# Patient Record
Sex: Female | Born: 1945 | Race: White | Hispanic: No | Smoking: Never smoker
Health system: Southern US, Community
[De-identification: ages and names within clinical notes are randomized; demographics above are authoritative.]

## PROBLEM LIST (undated history)

## (undated) DIAGNOSIS — M1990 Unspecified osteoarthritis, unspecified site: Secondary | ICD-10-CM

## (undated) DIAGNOSIS — M810 Age-related osteoporosis without current pathological fracture: Secondary | ICD-10-CM

---

## 2021-10-21 ENCOUNTER — Other Ambulatory Visit: Payer: Self-pay

## 2021-10-21 ENCOUNTER — Emergency Department (HOSPITAL_COMMUNITY): Payer: Medicare Other

## 2021-10-21 ENCOUNTER — Emergency Department (HOSPITAL_COMMUNITY)
Admission: EM | Admit: 2021-10-21 | Discharge: 2021-10-21 | Disposition: A | Discharge status: Home / Self Care | Payer: Medicare Other | Attending: Emergency Medicine | Admitting: Emergency Medicine

## 2021-10-21 ENCOUNTER — Encounter (HOSPITAL_COMMUNITY): Payer: Self-pay

## 2021-10-21 DIAGNOSIS — W109XXA Fall (on) (from) unspecified stairs and steps, initial encounter: Secondary | ICD-10-CM | POA: Insufficient documentation

## 2021-10-21 DIAGNOSIS — S32038A Other fracture of third lumbar vertebra, initial encounter for closed fracture: Secondary | ICD-10-CM | POA: Diagnosis not present

## 2021-10-21 DIAGNOSIS — M25551 Pain in right hip: Secondary | ICD-10-CM | POA: Insufficient documentation

## 2021-10-21 DIAGNOSIS — S3992XA Unspecified injury of lower back, initial encounter: Secondary | ICD-10-CM | POA: Diagnosis present

## 2021-10-21 DIAGNOSIS — S32018A Other fracture of first lumbar vertebra, initial encounter for closed fracture: Secondary | ICD-10-CM | POA: Insufficient documentation

## 2021-10-21 DIAGNOSIS — R0789 Other chest pain: Secondary | ICD-10-CM | POA: Insufficient documentation

## 2021-10-21 DIAGNOSIS — S32009A Unspecified fracture of unspecified lumbar vertebra, initial encounter for closed fracture: Secondary | ICD-10-CM

## 2021-10-21 HISTORY — DX: Age-related osteoporosis without current pathological fracture: M81.0

## 2021-10-21 HISTORY — DX: Unspecified osteoarthritis, unspecified site: M19.90

## 2021-10-21 MED ORDER — ONDANSETRON HCL 4 MG PO TABS: 4.0000 mg | ORAL_TABLET | Freq: Four times a day (QID) | ORAL | 0 refills | Status: AC | PRN

## 2021-10-21 MED ORDER — LIDOCAINE 5 % EX PTCH
1.0000 | MEDICATED_PATCH | CUTANEOUS | Status: DC
  Administered 2021-10-21: 1 via TRANSDERMAL
  Filled 2021-10-21: qty 1

## 2021-10-21 MED ORDER — IBUPROFEN 400 MG PO TABS
600.0000 mg | ORAL_TABLET | Freq: Once | ORAL | Status: AC
Start: 2021-10-21 — End: 2021-10-21
  Administered 2021-10-21: 600 mg via ORAL
  Filled 2021-10-21: qty 1

## 2021-10-21 MED ORDER — TRAMADOL HCL 50 MG PO TABS: 50.0000 mg | ORAL_TABLET | Freq: Four times a day (QID) | ORAL | 0 refills | Status: AC | PRN

## 2021-10-21 NOTE — ED Provider Triage Note (Signed)
Emergency Medicine Provider Triage Evaluation Note ? ?Wendy Brock , a 76 y.o. female  was evaluated in triage.  Pt complains of fall.  Pt was walking down her brick steps outside her house yesterday, misstepped and fell down 7-8 steps.  Didn't hit her head but report pain to R hip.  Able to ambulate ? ?Review of Systems  ?Positive: As above ?Negative: As above ? ?Physical Exam  ?BP (!) 163/85 (BP Location: Right Arm)   Pulse 65   Temp 98.4 ?F (36.9 ?C) (Oral)   Resp 14   Ht 5' 6.5" (1.689 m)   Wt 54.4 kg   SpO2 97%   BMI 19.08 kg/m?  ?Gen:   Awake, no distress   ?Resp:  Normal effort  ?MSK:   Moves extremities without difficulty  ?Other:  TTP R hip ? ?Medical Decision Making  ?Medically screening exam initiated at 9:23 AM.  Appropriate orders placed.  Lakyia Behe was informed that the remainder of the evaluation will be completed by another provider, this initial triage assessment does not replace that evaluation, and the importance of remaining in the ED until their evaluation is complete. ? ? ?  ?Fayrene Helper, PA-C ?10/21/21 [id number] ? ?

## 2021-10-21 NOTE — ED Notes (Signed)
Pt got brace and left without AVS. Patient refused last vitals ? ?

## 2021-10-21 NOTE — Discharge Instructions (Addendum)
Please return to the ED with any new symptoms such as weakness, numbness, pain that is not responsive to oral narcotic medication ?Please follow-up with an neurosurgeon when you return to [place] ?Please remain in the brace at all times unless showering ?Please pick up your prescription pain medication that I have sent in for you.  Please also pick up your prescription antinausea medication that I sent in for you. ?Please review the attached informational guides concerning your injury.  You have an L1 through L3 transverse spinous process fracture on the right side. ?

## 2021-10-21 NOTE — ED Provider Notes (Signed)
?[facility] ?Provider Note ? ? ?CSN: [id number] ?Arrival date & time: 10/21/21  0844 ? ?  ? ?History ? ?Chief Complaint  ?Patient presents with  ? Fall  ? ? ?Wendy Brock is a 76 y.o. female medical history of arthritis, osteoporosis.  Patient reports that last night, around 8:30 PM, she was walking down brick stairs when she tripped causing herself to fall down 8 brick stairs landing on her right side.  Patient denies taking blood thinners, denies losing consciousness, denies hitting her head.  Patient states that she was able to get up off the ground, and overnight her aches and pains increased causing her to present to ER.  Patient is mainly complaining of right-sided low back pain, right-sided chest wall pain as well as right-sided hip pain.  Patient also states that she is having a hard time ambulating.  At baseline, the patient states that she walks without assistance.  The patient denies any nausea, vomiting, neck pain, knee pain, abdominal pain, headache, lightheadedness, dizziness, confusion.  Patient denies any bowel or bladder dysfunction, groin numbness.  Patient states that she is having issues with ambulation. ? ? ?Fall ?Pertinent negatives include no abdominal pain and no headaches.  ? ?  ? ?Home Medications ?Prior to Admission medications   ?Medication Sig Start Date End Date Taking? Authorizing Provider  ?ondansetron (ZOFRAN) 4 MG tablet Take 1 tablet (4 mg total) by mouth every 6 (six) hours as needed for nausea or vomiting. 10/21/21  Yes Azucena Cecil, PA-C  ?traMADol (ULTRAM) 50 MG tablet Take 1 tablet (50 mg total) by mouth every 6 (six) hours as needed for severe pain. 10/21/21  Yes Azucena Cecil, PA-C  ?   ? ?Allergies    ?Codeine   ? ?Review of Systems   ?Review of Systems  ?Gastrointestinal:  Negative for abdominal pain, nausea and vomiting.  ?Musculoskeletal:  Positive for back pain and myalgias. Negative for arthralgias and neck pain.   ?Neurological:  Negative for dizziness, light-headedness and headaches.  ?All other systems reviewed and are negative. ? ?Physical Exam ?Updated Vital Signs ?BP (!) 163/85 (BP Location: Right Arm)   Pulse 65   Temp 98.4 ?F (36.9 ?C) (Oral)   Resp 14   Ht 5' 6.5" (1.689 m)   Wt 54.4 kg   SpO2 97%   BMI 19.08 kg/m?  ?Physical Exam ?Vitals and nursing note reviewed.  ?Constitutional:   ?   General: She is not in acute distress. ?   Appearance: Normal appearance. She is not ill-appearing, toxic-appearing or diaphoretic.  ?HENT:  ?   Head: Normocephalic and atraumatic.  ?   Nose: Nose normal. No congestion.  ?   Mouth/Throat:  ?   Mouth: Mucous membranes are moist.  ?   Pharynx: Oropharynx is clear.  ?Eyes:  ?   Extraocular Movements: Extraocular movements intact.  ?   Conjunctiva/sclera: Conjunctivae normal.  ?   Pupils: Pupils are equal, round, and reactive to light.  ?Cardiovascular:  ?   Rate and Rhythm: Normal rate and regular rhythm.  ?Pulmonary:  ?   Effort: Pulmonary effort is normal.  ?   Breath sounds: Normal breath sounds. No wheezing.  ?Abdominal:  ?   General: Abdomen is flat. Bowel sounds are normal.  ?   Palpations: Abdomen is soft.  ?   Tenderness: There is no abdominal tenderness.  ?Musculoskeletal:  ?   Cervical back: Normal range of motion and neck supple. No tenderness.  ?  Lumbar back: Tenderness and bony tenderness present.  ?   Comments: Patient has diffuse right-sided lumbar tenderness on palpation.  No overlying skin change.  ?Skin: ?   General: Skin is warm and dry.  ?   Capillary Refill: Capillary refill takes less than 2 seconds.  ?Neurological:  ?   General: No focal deficit present.  ?   Mental Status: She is alert and oriented to person, place, and time.  ?   GCS: GCS eye subscore is 4. GCS verbal subscore is 5. GCS motor subscore is 6.  ?   Cranial Nerves: Cranial nerves 2-12 are intact. No cranial nerve deficit.  ?   Sensory: Sensation is intact. No sensory deficit.  ?   Motor:  Motor function is intact. No weakness.  ?   Coordination: Coordination is intact. Heel to [facility] Test normal.  ? ? ?ED Results / Procedures / Treatments   ?Labs ?(all labs ordered are listed, but only abnormal results are displayed) ?Labs Reviewed - No data to display ? ?EKG ?None ? ?Radiology ?DG Ribs Unilateral W/Chest Right ? ?Result Date: 10/21/2021 ?CLINICAL DATA:  Fall with pain. EXAM: RIGHT RIBS AND CHEST - 3+ VIEW COMPARISON:  None Available. FINDINGS: Lungs are hyperexpanded. The lungs are clear without focal pneumonia, edema, pneumothorax or pleural effusion. The cardiopericardial silhouette is within normal limits for size. Bones are diffusely demineralized. Oblique views of the right ribs show no evidence for an acute displaced right-sided rib fracture although assessment is limited by the marked bony demineralization. IMPRESSION: 1. Hyperexpansion suggests emphysema. 2. No acute cardiopulmonary findings 3. No evidence for right-sided rib fracture although assessment limited by osteopenia. Electronically Signed   By: Misty Stanley M.D.   On: 10/21/2021 12:46  ? ?CT Lumbar Spine Wo Contrast ? ?Result Date: 10/21/2021 ?CLINICAL DATA:  Back trauma.  Fall last night EXAM: CT LUMBAR SPINE WITHOUT CONTRAST TECHNIQUE: Multidetector CT imaging of the lumbar spine was performed without intravenous contrast administration. Multiplanar CT image reconstructions were also generated. RADIATION DOSE REDUCTION: This exam was performed according to the departmental dose-optimization program which includes automated exposure control, adjustment of the mA and/or kV according to patient size and/or use of iterative reconstruction technique. COMPARISON:  None Available. FINDINGS: Segmentation: 5 lumbar type vertebrae. Alignment: No traumatic malalignment. Vertebrae: Acute fractures of the L1, L2, and L3 right transverse processes, displaced except for the L1 fracture. Paraspinal and other soft tissues: Negative for perispinal  mass or hematoma Disc levels: Mild for age degeneration, mainly facet spurring. No visible impingement. IMPRESSION: Acute fractures of the L1 through L3 right transverse processes. Electronically Signed   By: Jorje Guild M.D.   On: 10/21/2021 13:01  ? ?DG Hip Unilat W or Wo Pelvis 2-3 Views Right ? ?Result Date: 10/21/2021 ?CLINICAL DATA:  [place] down 5-6 per steps last night. Lateral and posterior right hip pain. Right flank pain. EXAM: DG HIP (WITH OR WITHOUT PELVIS) 2-3V RIGHT COMPARISON:  None available FINDINGS: The bilateral sacroiliac bilateral femoroacetabular and pubic symphysis joint spaces appear maintained. No proximal right femoral fracture is identified. Stool and air within the rectum overlie the right pubic body. No definite right pubic body fracture is seen. There appears to be mild chronic irregularity of the left inferior pubic ramus, likely remote healed fracture. No dislocation. IMPRESSION:: IMPRESSION: 1. No acute fracture is seen with attention to the right hip. 2. Mild angulation and foreshortening of the left inferior pubic ramus, likely a remote healed fracture. Electronically Signed  By: Yvonne Kendall M.D.   On: 10/21/2021 10:35   ? ?Procedures ?Procedures  ? ?Medications Ordered in ED ?Medications  ?lidocaine (LIDODERM) 5 % 1 patch (1 patch Transdermal Patch Applied 10/21/21 1248)  ?ibuprofen (ADVIL) tablet 600 mg (600 mg Oral Given 10/21/21 1247)  ? ? ?ED Course/ Medical Decision Making/ A&P ?  ?                        ?Medical Decision Making ?Amount and/or Complexity of Data Reviewed ?Radiology: ordered. ? ?Risk ?Prescription drug management. ? ? ?76 year old female presents to ED for evaluation.  Please see HPI for further details ? ?On examination, the patient is afebrile, nontachycardic, nonhypoxic.  The patient lung sounds are clear bilaterally.  Patient abdomen is soft and compressible in all 4 quadrants.  Patient has diffuse right-sided low back pain and issues with ambulation.   The patient is able to ambulate freely however it is very labored.  Patient neurological examination shows no focal neurodeficits. ? ?Patient worked up utilizing following labs and imaging studies interpreted by me

## 2021-10-21 NOTE — ED Triage Notes (Signed)
Pt arrived POV from home c/o a fall that happened last night. Pt states she was leaving out of the house and fell down 7-8 brick stairs. Pt denies hitting her head or LOC. Pt states she does not take a blood thinner. Pt c/o of right hip pain and right lower back pain.  ?

## 2021-10-21 NOTE — ED Notes (Signed)
Pt is standing lobby looks like she going to fall. I ask pt if I can help her sit down pt stated no its to hard to sit and get back up. I asked if a wheelchair would be better she stated "no Im fine thank you for asking" pt continues to stand in placed.  ?

## 2021-10-21 NOTE — Progress Notes (Signed)
Orthopedic Tech Progress Note ?Patient Details:  ?Wendy Brock ?08/26/1945 ?[id number] ?Called in order to [facility] for [facility] ?Patient ID: Wendy Brock, female   DOB: 24-Jun-1945, 76 y.o.   MRN: [id number] ? ?[name] A Geoffrey Hynes ?10/21/2021, 2:11 PM ? ?

## 2021-10-21 NOTE — ED Notes (Signed)
Ortho called to place brace ? ?

## 2022-08-21 IMAGING — DX DG RIBS W/ CHEST 3+V*R*
3 series · 3 of 3 positions shown · non-contrast
Comparison: None Available.

CLINICAL DATA: Fall with pain.

EXAM:
RIGHT RIBS AND CHEST - 3+ VIEW

[w chest pa]
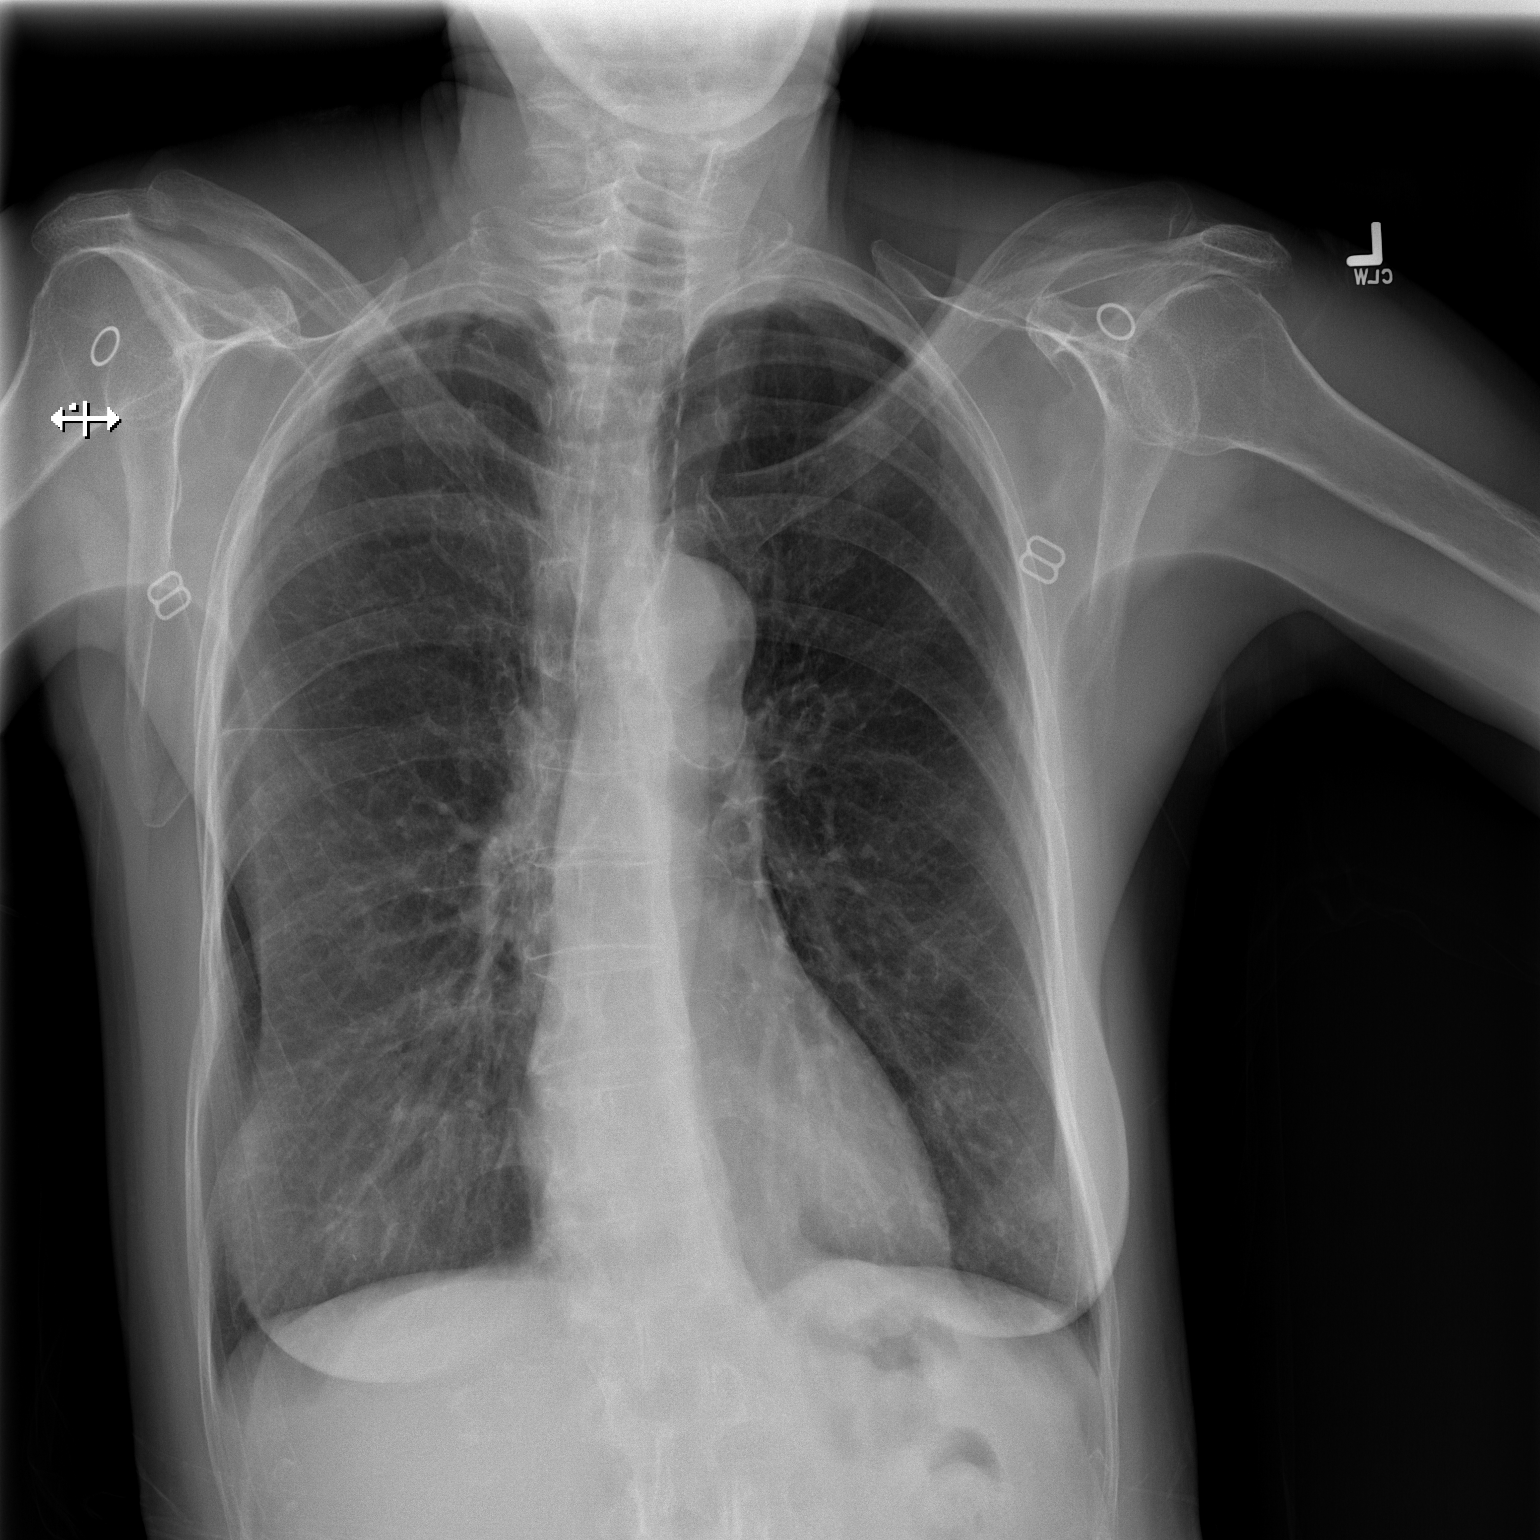

[w ribs ap upper right]
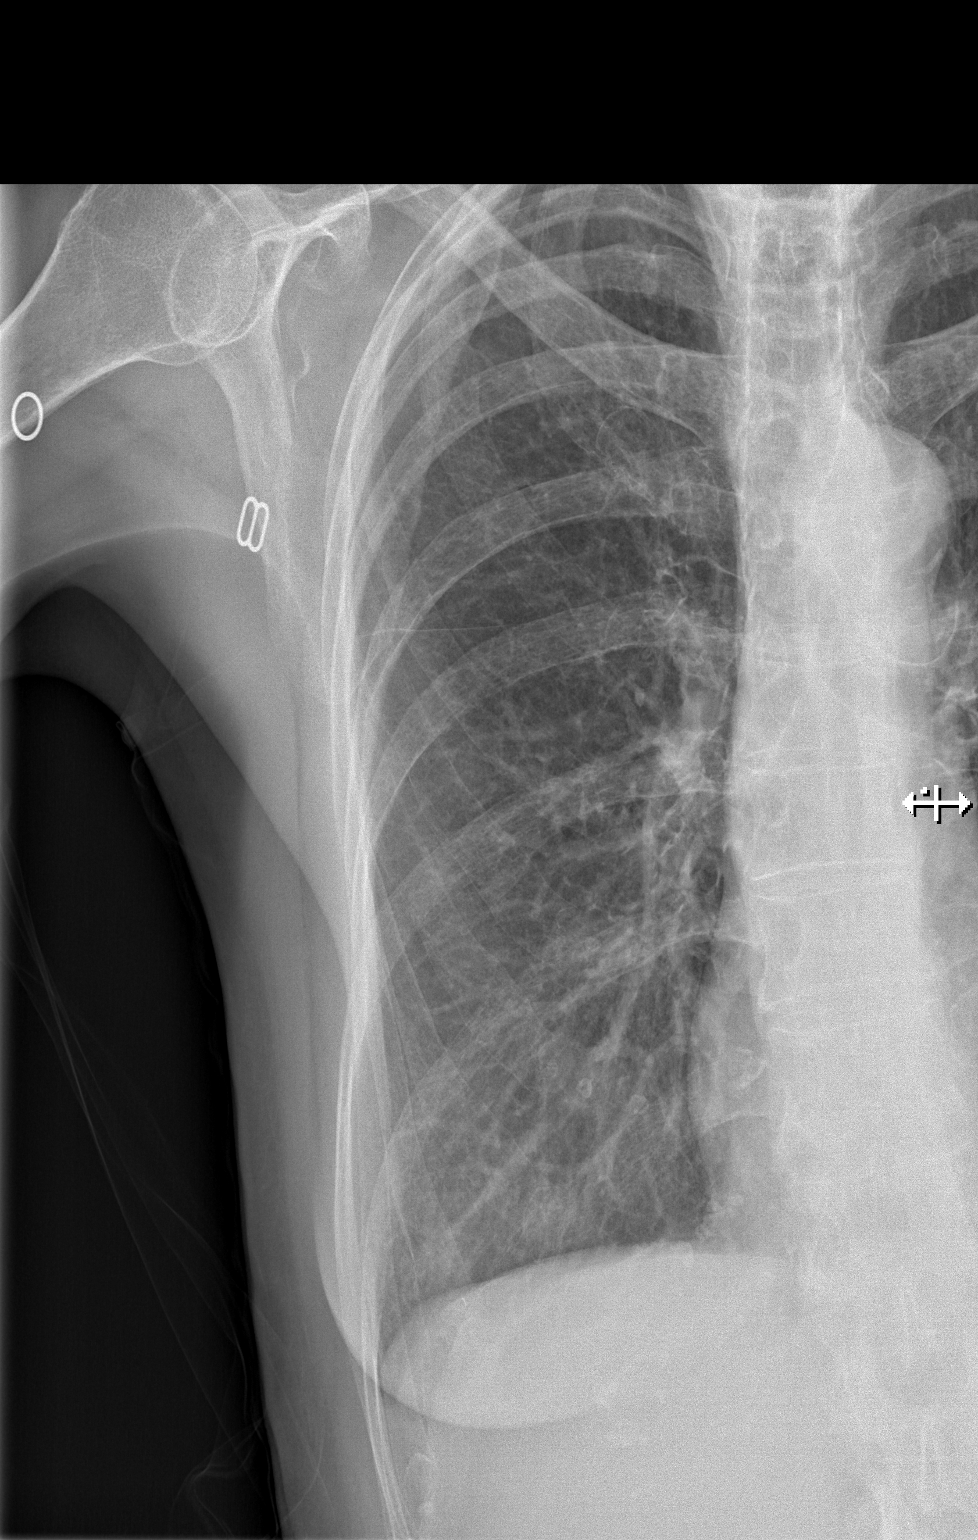

[w ribs obl right]
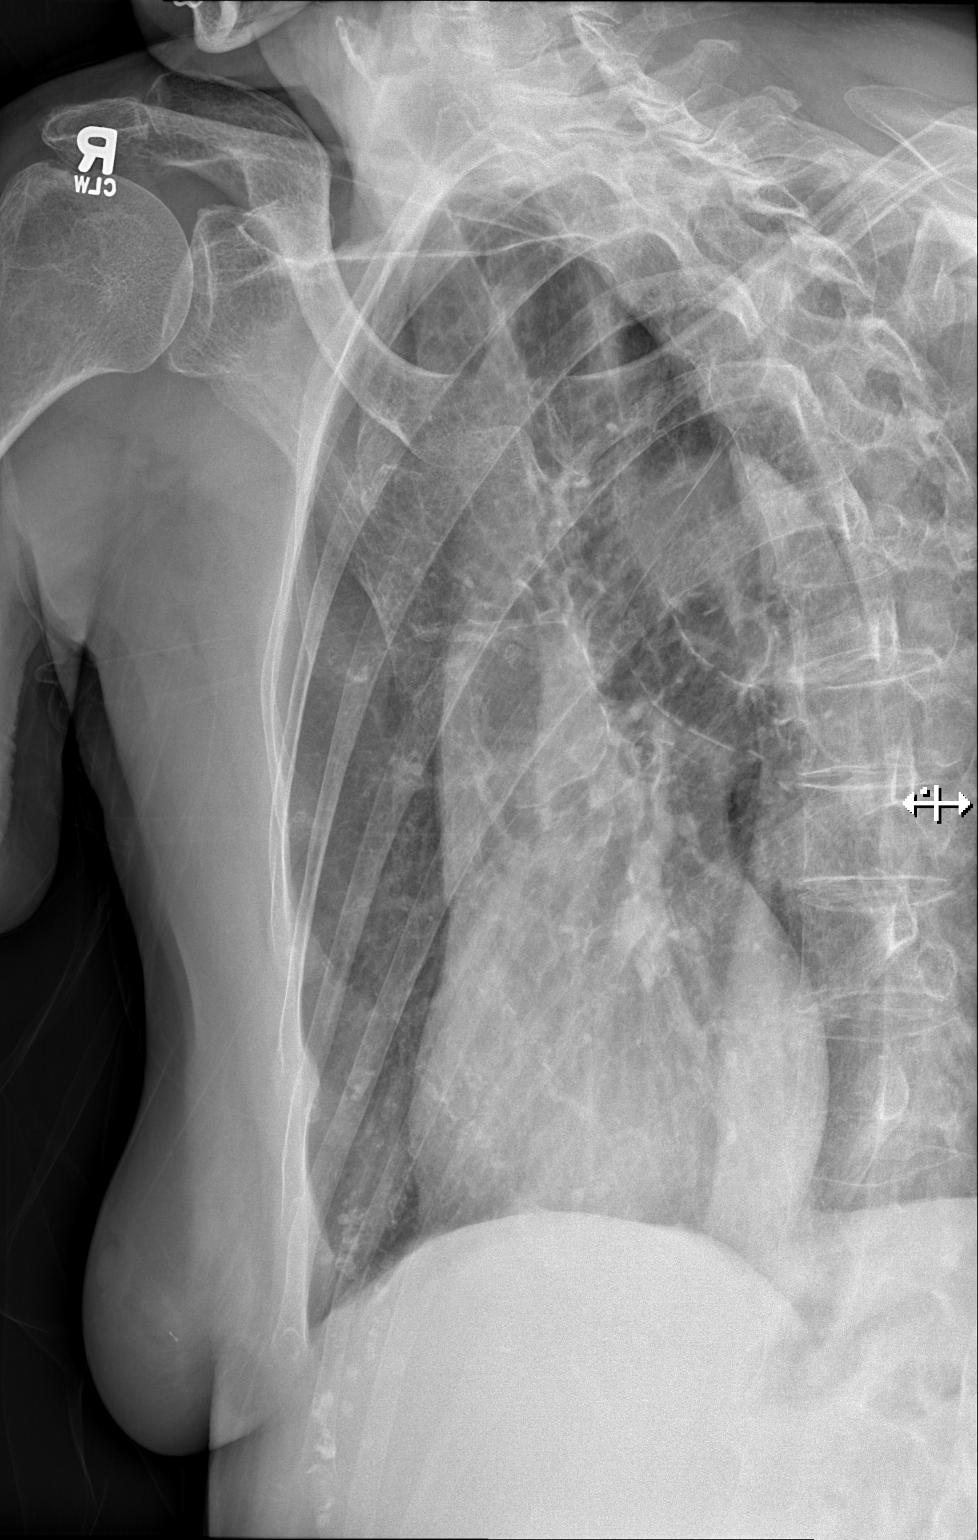

[3 of 3 positions shown; findings below may reference images not displayed]

FINDINGS: Lungs are hyperexpanded. The lungs are clear without focal
pneumonia, edema, pneumothorax or pleural effusion. The
cardiopericardial silhouette is within normal limits for size. Bones
are diffusely demineralized. Oblique views of the right ribs show no
evidence for an acute displaced right-sided rib fracture although
assessment is limited by the marked bony demineralization.
IMPRESSION: 1. Hyperexpansion suggests emphysema.
2. No acute cardiopulmonary findings
3. No evidence for right-sided rib fracture although assessment
limited by osteopenia.

## 2022-08-21 IMAGING — CT CT L SPINE W/O CM
3 series · 12 of 33 positions shown, 14 images · non-contrast
Comparison: None Available.

CLINICAL DATA: Back trauma.  Fall last night



[Series 5: l spine 2.0 st · axial · 0.28mm/px · z∈[+964,+1124]mm · 4 of 113 slices shown, 5 images]
[im 18/113  soft-tissue]
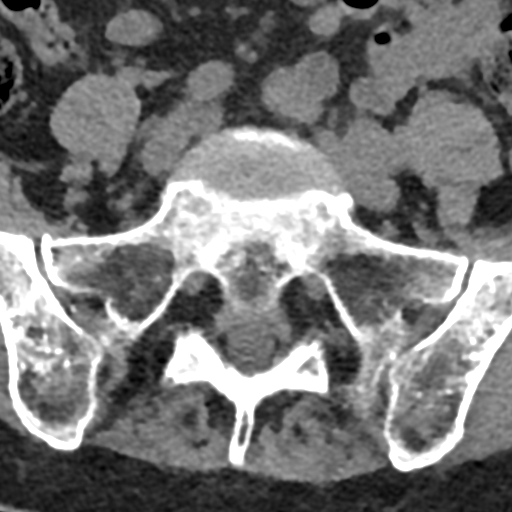
[im 18/113  bone]
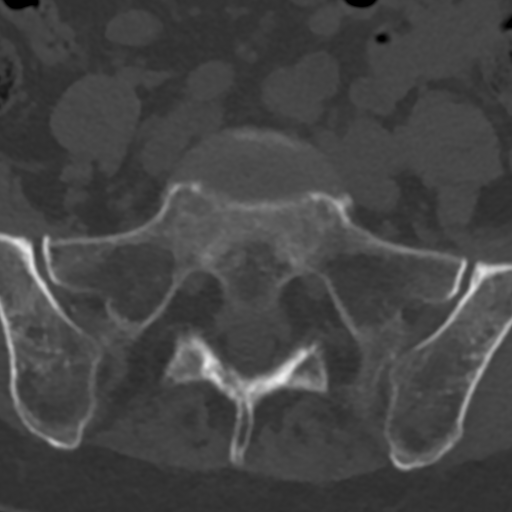
[im 44/113  bone]
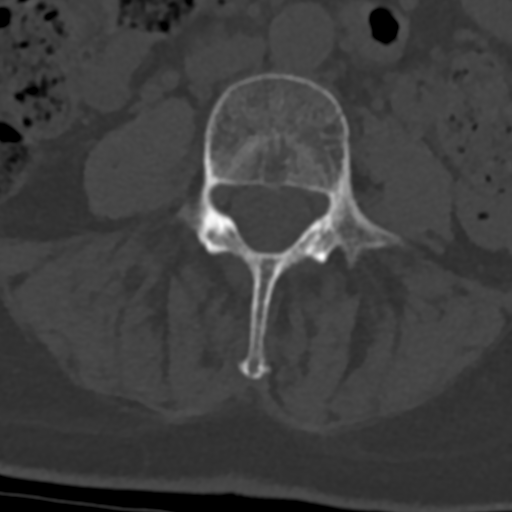
[im 69/113  bone]
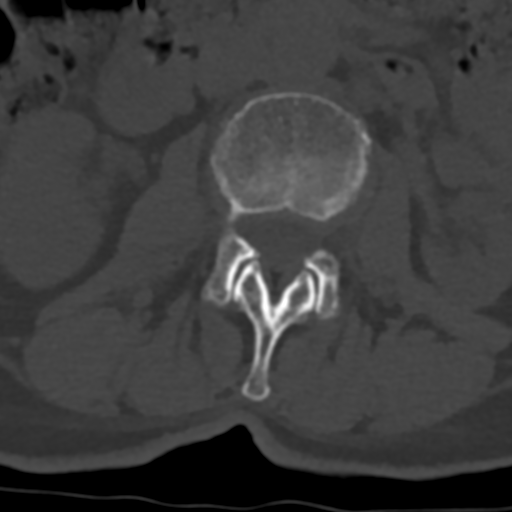
[im 95/113  bone]
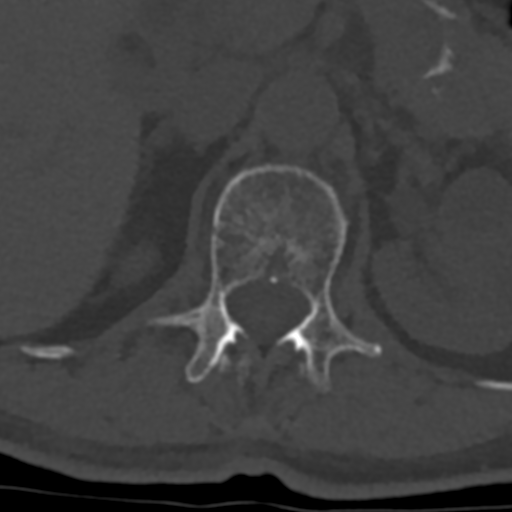

[Series 10: coronal st · coronal · 0.31mm/px · 3 of 71 slices shown]
[im 15/71  bone]
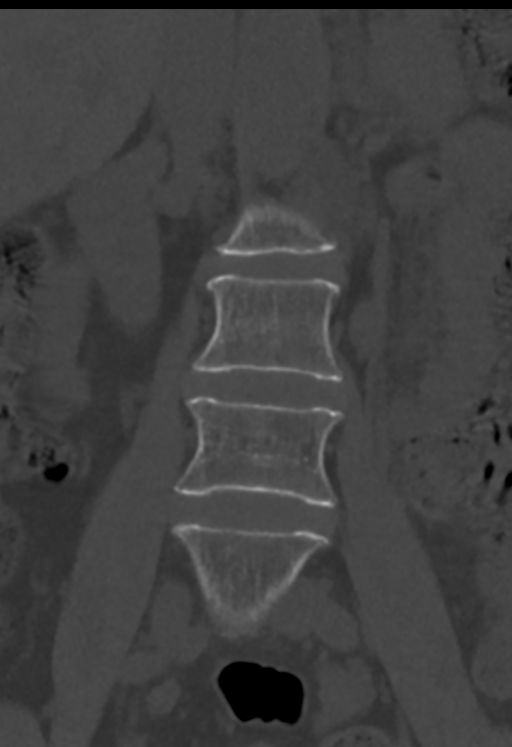
[im 29/71  bone]
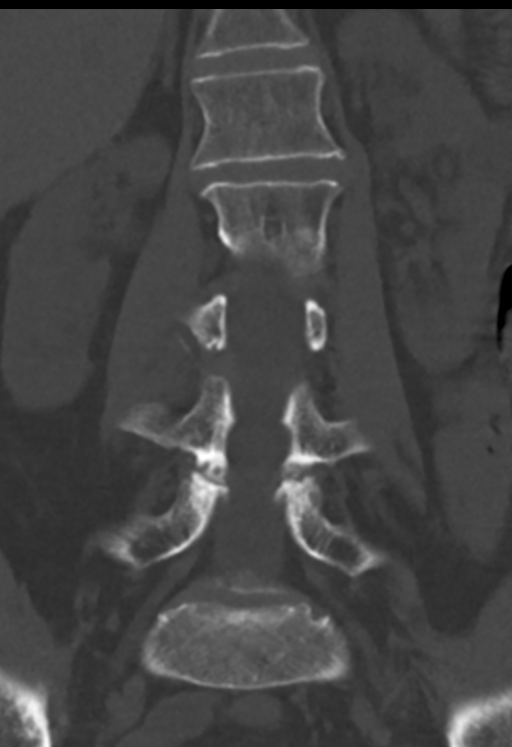
[im 43/71  bone]
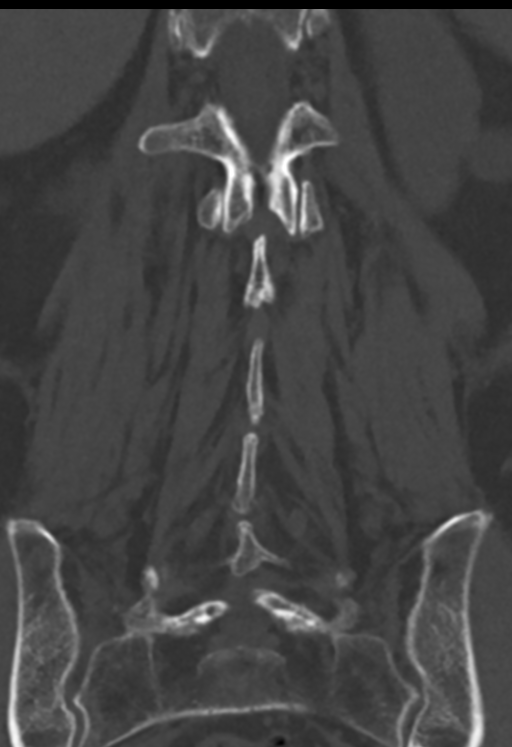

[Series 11: sagittal st · sagittal · 0.26mm/px · 5 of 77 slices shown, 6 images]
[im 26/77  bone]
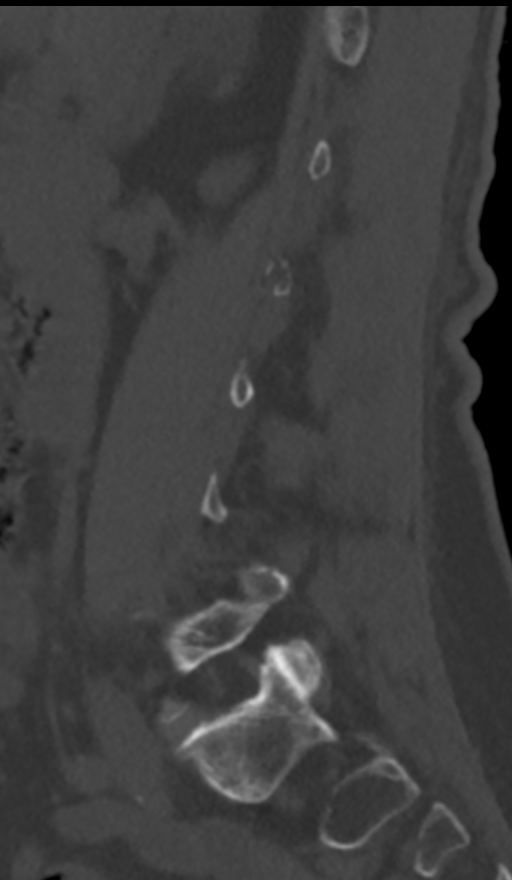
[im 32/77  bone]
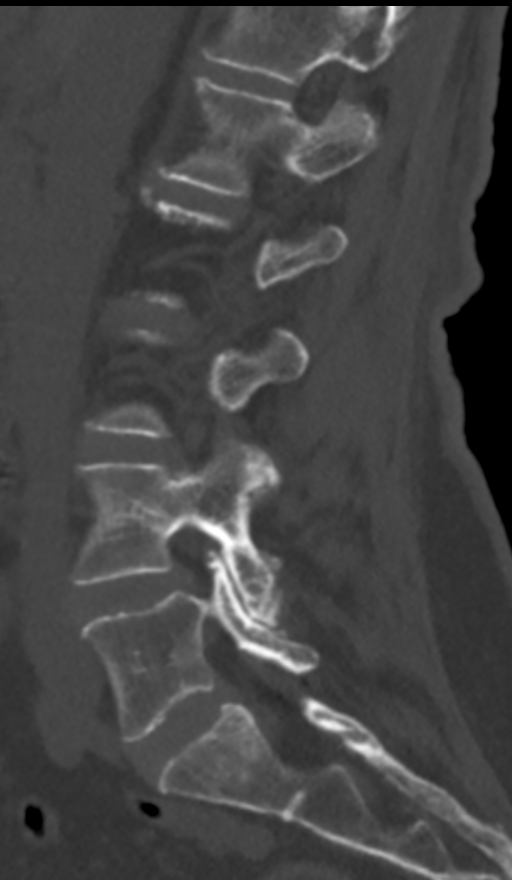
[im 39/77  soft-tissue]
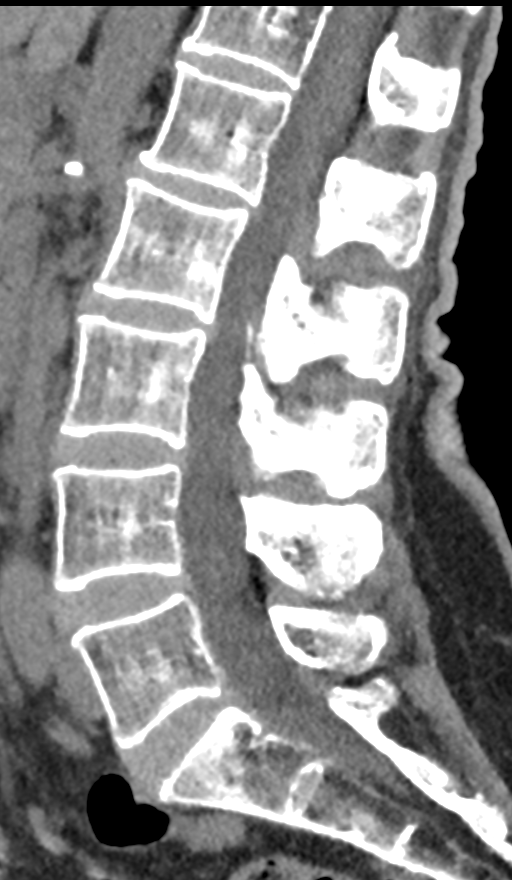
[im 39/77  bone]
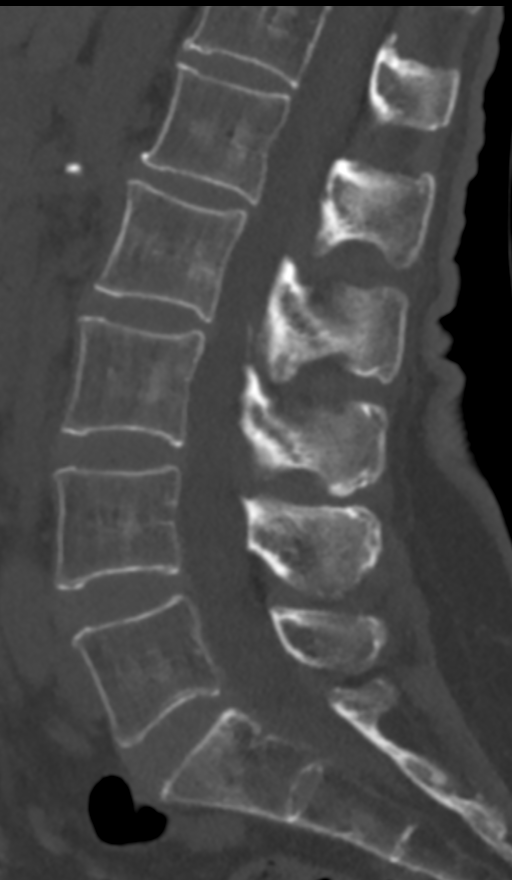
[im 45/77  bone]
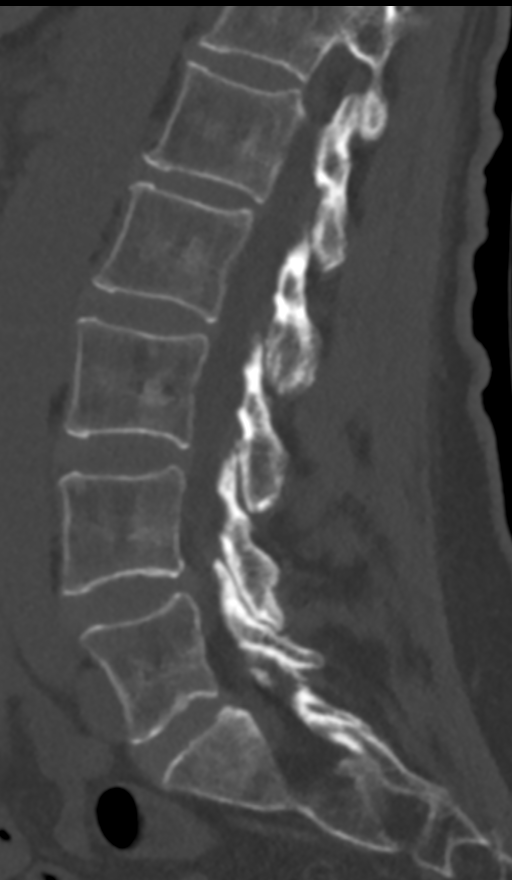
[im 51/77  bone]
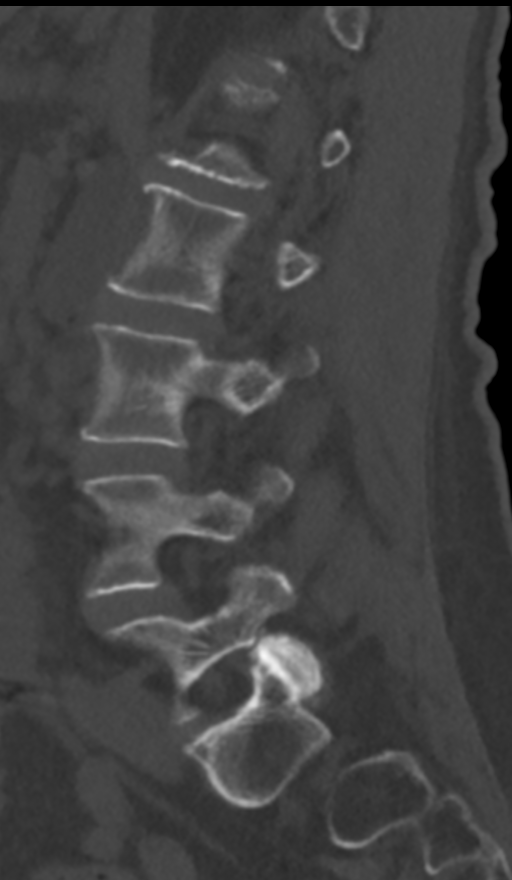

[12 of 33 positions shown; findings below may reference images not displayed]

FINDINGS: Segmentation: 5 lumbar type vertebrae.

Alignment: No traumatic malalignment.

Vertebrae: Acute fractures of the L1, L2, and L3 right transverse
processes, displaced except for the L1 fracture.

Paraspinal and other soft tissues: Negative for perispinal mass or
hematoma

Disc levels:

Mild for age degeneration, mainly facet spurring. No visible
impingement.
IMPRESSION: Acute fractures of the L1 through L3 right transverse processes.
